# Patient Record
Sex: Male | Born: 1985 | Race: White | Hispanic: No | Marital: Single | Smoking: Never smoker
Health system: Southern US, Community
[De-identification: ages and names within clinical notes are randomized; demographics above are authoritative.]

## PROBLEM LIST (undated history)

## (undated) DIAGNOSIS — F329 Major depressive disorder, single episode, unspecified: Secondary | ICD-10-CM

## (undated) DIAGNOSIS — T8859XA Other complications of anesthesia, initial encounter: Secondary | ICD-10-CM

## (undated) DIAGNOSIS — T4145XA Adverse effect of unspecified anesthetic, initial encounter: Secondary | ICD-10-CM

## (undated) DIAGNOSIS — F32A Depression, unspecified: Secondary | ICD-10-CM

## (undated) DIAGNOSIS — Z9089 Acquired absence of other organs: Secondary | ICD-10-CM

## (undated) DIAGNOSIS — F419 Anxiety disorder, unspecified: Secondary | ICD-10-CM

## (undated) HISTORY — DX: Adverse effect of unspecified anesthetic, initial encounter: T41.45XA

## (undated) HISTORY — DX: Other complications of anesthesia, initial encounter: T88.59XA

## (undated) HISTORY — DX: Acquired absence of other organs: Z90.89

---
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
## 2006-08-29 ENCOUNTER — Ambulatory Visit (HOSPITAL_COMMUNITY): Admission: RE | Admit: 2006-08-29 | Discharge: 2006-08-29 | Payer: Self-pay | Admitting: Family Medicine

## 2008-05-05 ENCOUNTER — Emergency Department (HOSPITAL_COMMUNITY): Admission: EM | Admit: 2008-05-05 | Discharge: 2008-05-05 | Payer: Self-pay | Admitting: Emergency Medicine

## 2009-02-16 ENCOUNTER — Emergency Department (HOSPITAL_COMMUNITY): Admission: EM | Admit: 2009-02-16 | Discharge: 2009-02-16 | Payer: Self-pay | Admitting: Emergency Medicine

## 2010-09-20 ENCOUNTER — Encounter: Payer: Self-pay | Admitting: Family Medicine

## 2010-09-20 ENCOUNTER — Encounter: Payer: Self-pay | Admitting: Internal Medicine

## 2011-06-02 LAB — ROCKY MTN SPOTTED FVR AB, IGG-BLOOD: RMSF IgG: 0.14 IV

## 2011-06-02 LAB — CBC
HCT: 40.6
Hemoglobin: 13.9
MCHC: 34.3
MCV: 86.6
Platelets: 264
RBC: 4.69
RDW: 12.4
WBC: 11.8 — ABNORMAL HIGH

## 2011-06-02 LAB — BASIC METABOLIC PANEL
BUN: 8
CO2: 27
Calcium: 9.1
Chloride: 110
Creatinine, Ser: 0.89
GFR calc Af Amer: >60
GFR calc non Af Amer: >60
Glucose, Bld: 95
Potassium: 3.8
Sodium: 141

## 2011-06-02 LAB — DIFFERENTIAL
Basophils Absolute: 0
Basophils Relative: 0
Eosinophils Absolute: 0
Eosinophils Relative: 0
Lymphocytes Relative: 19
Lymphs Abs: 2.2
Monocytes Absolute: 0.7
Monocytes Relative: 6
Neutro Abs: 8.8 — ABNORMAL HIGH
Neutrophils Relative %: 75

## 2011-06-02 LAB — ROCKY MTN SPOTTED FVR AB, IGM-BLOOD: RMSF IgM: 0.26 IV

## 2016-07-30 HISTORY — PX: WOUND DEBRIDEMENT: SHX247

## 2016-08-19 ENCOUNTER — Encounter (HOSPITAL_COMMUNITY): Payer: Self-pay

## 2016-08-19 ENCOUNTER — Emergency Department (HOSPITAL_COMMUNITY)
Admission: EM | Admit: 2016-08-19 | Discharge: 2016-08-19 | Disposition: A | Discharge status: Home / Self Care | Payer: Self-pay | Attending: Emergency Medicine | Admitting: Emergency Medicine

## 2016-08-19 DIAGNOSIS — Z7982 Long term (current) use of aspirin: Secondary | ICD-10-CM | POA: Insufficient documentation

## 2016-08-19 DIAGNOSIS — S43401A Unspecified sprain of right shoulder joint, initial encounter: Secondary | ICD-10-CM | POA: Insufficient documentation

## 2016-08-19 DIAGNOSIS — Y939 Activity, unspecified: Secondary | ICD-10-CM | POA: Insufficient documentation

## 2016-08-19 DIAGNOSIS — Y999 Unspecified external cause status: Secondary | ICD-10-CM | POA: Insufficient documentation

## 2016-08-19 DIAGNOSIS — T24031A Burn of unspecified degree of right lower leg, initial encounter: Secondary | ICD-10-CM | POA: Insufficient documentation

## 2016-08-19 DIAGNOSIS — Y92410 Unspecified street and highway as the place of occurrence of the external cause: Secondary | ICD-10-CM | POA: Insufficient documentation

## 2016-08-19 DIAGNOSIS — T24001A Burn of unspecified degree of unspecified site of right lower limb, except ankle and foot, initial encounter: Secondary | ICD-10-CM

## 2016-08-19 DIAGNOSIS — Z23 Encounter for immunization: Secondary | ICD-10-CM | POA: Insufficient documentation

## 2016-08-19 MED ORDER — TETANUS-DIPHTH-ACELL PERTUSSIS 5-2.5-18.5 LF-MCG/0.5 IM SUSP
0.5000 mL | Freq: Once | INTRAMUSCULAR | Status: AC
  Administered 2016-08-19: 0.5 mL via INTRAMUSCULAR
  Filled 2016-08-19: qty 0.5

## 2016-08-19 MED ORDER — IBUPROFEN 600 MG PO TABS: 600.0000 mg | ORAL_TABLET | Freq: Four times a day (QID) | ORAL | 0 refills | Status: DC | PRN

## 2016-08-19 MED ORDER — IBUPROFEN 800 MG PO TABS
800.0000 mg | ORAL_TABLET | Freq: Once | ORAL | Status: AC
  Administered 2016-08-19: 800 mg via ORAL
  Filled 2016-08-19: qty 1
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
## 2016-12-04 ENCOUNTER — Encounter (HOSPITAL_COMMUNITY): Payer: Self-pay

## 2016-12-04 ENCOUNTER — Emergency Department (HOSPITAL_COMMUNITY)
Admission: EM | Admit: 2016-12-04 | Discharge: 2016-12-04 | Disposition: A | Discharge status: Home / Self Care | Payer: Self-pay | Attending: Emergency Medicine | Admitting: Emergency Medicine

## 2016-12-04 DIAGNOSIS — Z7982 Long term (current) use of aspirin: Secondary | ICD-10-CM | POA: Insufficient documentation

## 2016-12-04 DIAGNOSIS — K047 Periapical abscess without sinus: Secondary | ICD-10-CM | POA: Insufficient documentation

## 2016-12-04 DIAGNOSIS — Z8659 Personal history of other mental and behavioral disorders: Secondary | ICD-10-CM | POA: Insufficient documentation

## 2016-12-04 DIAGNOSIS — K0889 Other specified disorders of teeth and supporting structures: Secondary | ICD-10-CM

## 2016-12-04 HISTORY — DX: Major depressive disorder, single episode, unspecified: F32.9

## 2016-12-04 HISTORY — DX: Depression, unspecified: F32.A

## 2016-12-04 HISTORY — DX: Anxiety disorder, unspecified: F41.9

## 2016-12-04 MED ORDER — PENICILLIN V POTASSIUM 250 MG PO TABS: 250.0000 mg | ORAL_TABLET | Freq: Three times a day (TID) | ORAL | 0 refills | Status: AC

## 2016-12-04 NOTE — ED Provider Notes (Signed)
The patient is a 31 year old male with a history of depression, he is currently being treated by his family doctor however he feels like his symptoms are persistent, he is not receiving any improvement through this.  He wants more resources - does not have any SI / HI, and no hallucinations.  Also has some toothache in rear molar.  No swelling, no trismus, no drainage / no abscess.  Stable for d/c The pt has already made appt for outpatient mental health eval while in ED.  Medical screening examination/treatment/procedure(s) were conducted as a shared visit with non-physician practitioner(s) and myself.  I personally evaluated the patient during the encounter.  Clinical Impression:   Final diagnoses:  Pain, dental  History of depressive symptoms         Eber Hong, MD 12/04/16 [phone]

## 2016-12-04 NOTE — ED Triage Notes (Signed)
Pt reports depression x one year. Pt has been given antidepressant by PCP and took for 2 weeks stating he stopped due to crazy dreams. Then was given another antidepressant on 4/8 and did not start as he thought was the same medication. Denies SI/HI at this time. States lost mother year ago and others in family. Not sleeping well, loss of weight, not motivated. Also complaining of right lower dental pain for one month

## 2016-12-04 NOTE — ED Provider Notes (Signed)
[facility]-EMERGENCY DEPT Provider Note   CSN: [id number] Arrival date & time: 12/04/16  4098     History   Chief Complaint Chief Complaint  Patient presents with  . Depression  . Dental Pain    HPI Eduardo Stuart is a 31 y.o. male who presents with ongoing depression symptoms for the past year. Patient states he has been experiencing anhedonia, irritability, decreased appetite/weight loss, and changes in his sleep pattern. Patient reports that symptoms were triggered by the death of his mother in 10-22-15. He was initially evaluated by a therapist but there was question of whether this was depression or grief and he was not started on any medications. He has intermittently seen a therapist since then, last time being 2 weeks ago, but with no regular follow-up. He states he often consults his friend or sister in law who is one. He was seen at [facility] a few weeks ago and was started on Duloxetine, but he reports that it made him have dreams and SI and so he stopped after 2 weeks. He was supposed to be prescribed a new medication but noted that it was the same one and has not taken it. He currently lives with his dad. He denies any SI/HI, auditory/visual hallucinations.   He also reports of persistent dental pain that began a few weeks ago after he cracked his tooth. He has taken tylenol for the pain with minimal relief. He has not been evaluated by the dentist. He's had some subjective fevers a few weeks ago, associated with some GI distress, but none since. He is still able to tolerate PO. No facial swelling associated.   The history is provided by the patient.    Past Medical History:  Diagnosis Date  . Anxiety   . Depression     There are no active problems to display for this patient.   History reviewed. No pertinent surgical history.     Home Medications    Prior to Admission medications   Medication Sig Start Date End Date Taking? Authorizing Provider  Aspirin-Caffeine  845-65 MG PACK Take 2 Packages by mouth daily as needed (pain).   Yes Historical Provider, MD  DULoxetine (CYMBALTA) 30 MG capsule Take 30 mg by mouth daily.    Historical Provider, MD  DULoxetine (CYMBALTA) 60 MG capsule Take 60 mg by mouth daily.    Historical Provider, MD  penicillin v potassium (VEETID) 250 MG tablet Take 1 tablet (250 mg total) by mouth 3 (three) times daily. 12/04/16 12/11/16  Maxwell Caul, PA-C    Family History No family history on file.  Social History Social History  Substance Use Topics  . Smoking status: Never Smoker  . Smokeless tobacco: Never Used  . Alcohol use Yes     Comment: beer every other week     Allergies   Sulfa antibiotics   Review of Systems Review of Systems  Constitutional: Positive for activity change, appetite change and fever (subjective).  HENT: Positive for dental problem. Negative for drooling and facial swelling.   Respiratory: Negative for shortness of breath.   Cardiovascular: Negative for chest pain.  Gastrointestinal: Negative for abdominal pain, diarrhea, nausea and vomiting.  All other systems reviewed and are negative.    Physical Exam Updated Vital Signs BP 93/80   Pulse (!) 102   Temp 99.1 F (37.3 C) (Oral)   Resp 12   Ht 6' (1.829 m)   Wt 63.5 kg   SpO2 97%   BMI  18.99 kg/m   Physical Exam  Constitutional: He appears well-developed and well-nourished.  HENT:  Head: Normocephalic and atraumatic.  Mouth/Throat: Mucous membranes are normal. No oral lesions. No trismus in the jaw. Dental abscesses present.    No right sided facial swelling, redness, or tenderness. No gingival swelling.  Eyes: Conjunctivae and EOM are normal. Right eye exhibits no discharge. Left eye exhibits no discharge. No scleral icterus.  Cardiovascular: Normal rate, regular rhythm and intact distal pulses.   Pulmonary/Chest: Effort normal and breath sounds normal.  Musculoskeletal: He exhibits no deformity.  Neurological: He is  alert.  Skin: Skin is warm and dry.  Psychiatric: He has a normal mood and affect. His speech is normal and behavior is normal.  No flat affected noted     ED Treatments / Results  Labs (all labs ordered are listed, but only abnormal results are displayed) Labs Reviewed - No data to display  EKG  EKG Interpretation None       Radiology No results found.  Procedures Procedures (including critical care time)  Medications Ordered in ED Medications - No data to display   Initial Impression / Assessment and Plan / ED Course  I have reviewed the triage vital signs and the nursing notes.  Pertinent labs & imaging results that were available during my care of the patient were reviewed by me and considered in my medical decision making (see chart for details).    31 yo M presents with persistent depression that has been ongoing for a year. Has been seen by outpatient psychiatric resource, but very inconsistent follow-up. Was recently started on Duloxetine by [facility] but patient stopped after 2 weeks because of side effects. Patient is afebrile, non-toxic appearing, sitting comfortably on examination table. No flat affected noted. No active SI/HI or auditory/visual hallucinations. Also complaining of some dental pain after he cracked his tooth a few weeks ago. He does not follow-up with a dentist. Low suspicion of dental abscess given benign physical exam.   Given history/physical exam, patient does not meet criteria for acute psychiatric intervention. He is not currently having any SI/HI. He lives at home with his father. Given his duration of symptoms, he would be managed by out continued, consistent outpatient psychiatric therapy. Discussed with patient that I do not want to start him on a new anti-depressant medication in an acute ED setting given lack of follow-up.  Will provide list of outpatient psychiatric services for patient to follow-up with.   No signs of  abscess on exam, but patient very concerned. Will plan to cover with abx and NSAIDs for pain relief. Will provide dental resources for outpatient follow-up.   Discussed plan with patient. He is planning to go to one of the referred psych resources today for further follow-up. Still Return precautions discussed. Patient expresses understanding and agreement to plan.      Final Clinical Impressions(s) / ED Diagnoses   Final diagnoses:  Pain, dental  History of depressive symptoms    New Prescriptions Discharge Medication List as of 12/04/2016 12:13 PM    START taking these medications   Details  penicillin v potassium (VEETID) 250 MG tablet Take 1 tablet (250 mg total) by mouth 3 (three) times daily., Starting Sat 12/04/2016, Until Sat 12/11/2016, Print         Maxwell Caul, PA-C 12/04/16 1245    Eber Hong, MD 12/04/16 [phone]

## 2016-12-04 NOTE — Discharge Instructions (Addendum)
RESOURCE GUIDE  Dental Problems  Patients with Medicaid: [facility]                     [facility] [phone] [address]                                           [phone] [address] [employer] [address] Phone:  [phone]                                                  Phone:  [phone]  If unable to pay or uninsured, contact:  [facility] for the adult dental clinic.  Chronic Pain Problems Contact [facility]  [phone] Patients need to be referred by their primary care doctor.  Insufficient Money for Medicine Contact [employer]:  call "211" or [facility] [phone].  No Primary Care Doctor Call [facility]  [phone] Other agencies that provide inexpensive medical care    [facility]  [phone]    [facility]  [phone]    [facility]  [phone]    [facility]  [phone]    Planned Parenthood  [phone]    [facility]  [phone]    Substance Abuse Treatment Programs  Intensive Outpatient Programs [facility]     [address]      [place], [place]                   [phone]       [facility] [address] [place], [place] [phone]  [facility]     (Inpatient and outpatient)     [address]           [phone]    [facility] [phone] (Suboxone and Methadone)  [address]      [place], [place] [address]      [phone]       [address] [place], [place] [phone]  [facility] (Outpatient/Inpatient, Chemical)    (insurance only) [phone]             [facility] (Groups & Residential) [place], [place] [phone]     [facility]     [address]     [place], [place]      [phone]       [facility] (for caregivers and family) [phone]  [address] [place], [place] [phone]      Residential Treatment Programs [facility]      [address], [place], [place] [address]  [phone]       [facility] [address] [place], [place] [address] [phone]  [facility] [place]        [phone]       [facility] [phone]  [facility] ([facility]             [address]  [place], [place]                                                [phone] or [phone]                               [facility] [address] [place], [address] [phone]  [facility]    [address]      [place], [place]     [phone]       [facility] [address] [place], [place] [phone]  [facility]   [address] [place], [place] [address]     [phone]      Admissions: 8am-3pm M-F  [facility] [address] [place] [address] [phone]  [facility] Services ([facility]) [address] [place], [place] [phone]  [facility]: Residential Program [phone] Days)   [place], [place]      [phone] or [phone]     [facility]: [facility] [place], [place] (Walk in Hours over the weekend or by referral)  [facility] [address] [place], [place], [place] [address] [phone]  Crisis Mobile: [facility]:  [phone] (for crisis response 24 hours a day) [facility] Hotline:      7196462601 Outpatient Psychiatry and Counseling  [facility]: Mobile Crisis Management 24 hours:  [phone]  [facility] of the [employer] sliding scale fee and walk in schedule: M-F 8am-12pm/1pm-3pm [address]  [place], [place] [address] [phone]  [facility] [address] [place], [place] [address] [phone]  [facility] ([facility] as [facility] [employer])- new patient walk-in appointments available Monday - Friday 8am -3pm.          [address] [place], [place] [address] [phone] or crisis line- [phone]  [facility] Services/ Intensive Outpatient Therapy Program [address] [place], [place] [address] [phone]  [facility]                  Crisis Services      [phone] [address]     [place], [place] [address]                 [facility] [phone]. [address] [place], [place] [address]   [facility] of Care          [address]  [place], [place] [address]       [phone]  [facility] [address] [place], [place] [address] [phone]  [facility]    [address]    [place], [place] [address]     [phone]       Andee Poles, MD     [address]     [place] [address]     [phone]       [facility] [address] [place] [address]  [facility]     [address] [place], [place]      [phone]       [facility] Eulogio Ditch, MD [address] [place], [place] [address] [phone]  [facility]     [address]     [place], [place] [address]     [phone]       [facility] [address] [place], [place] [address] [phone] Resources for Temporary Residential Assistance/Crisis Centers  [facility]) M-F 8am-3pm   [address] [place], [place] [address]   [phone] Services include: laundry, barbering, support groups, case management, phone  & computer access, showers, AA/NA mtgs, mental health/substance abuse nurse, job skills class, disability information, VA assistance, spiritual classes, etc.   HOMELESS  SHELTERS  [facility]     [facility]   [address] [place]     [phone]              [employer] (women and children)       [address] [place], [place] [address] [phone] [email or web address] for application and process Application Required  [facility] [employer] [facility]   [address]    [place] [address]     [phone]                    [facility] [place] [address] [place], [place] [address] [phone] application appt.) Application Required  [facility] (women only)    [address]     [place], [place] [address]     [phone]      Intake starts 6pm daily Need valid ID, SSC, & Police report [employer] [address] [place], [place] [phone] Application Required  [employer] (men only)     [address]      [place], [place]     [phone]       [facility] of the [place] (Pregnant women only) [address] [place], [place] [phone]  [facility]      [address]      [place], [place] [address]     [phone]             [facility] [address] [place], [place] [phone] 90 day commitment/SA/Application process  [facility](men only)     [address]     [place], [place]     [phone]       Check-in at [facility] [address] [place], [place] [address] [phone] Men/Women/Women and Children must be there by 7 pm  [facility] [place], [place] [phone]

## 2017-05-11 ENCOUNTER — Encounter: Payer: Self-pay | Admitting: Gastroenterology

## 2017-07-01 ENCOUNTER — Ambulatory Visit: Payer: Self-pay | Admitting: Gastroenterology

## 2017-07-01 ENCOUNTER — Encounter: Payer: Self-pay | Admitting: Gastroenterology

## 2017-07-14 ENCOUNTER — Emergency Department (HOSPITAL_COMMUNITY)
Admission: EM | Admit: 2017-07-14 | Discharge: 2017-07-14 | Disposition: A | Discharge status: Home / Self Care | Payer: Self-pay | Attending: Emergency Medicine | Admitting: Emergency Medicine

## 2017-07-14 ENCOUNTER — Encounter (HOSPITAL_COMMUNITY): Payer: Self-pay | Admitting: *Deleted

## 2017-07-14 ENCOUNTER — Other Ambulatory Visit: Payer: Self-pay

## 2017-07-14 ENCOUNTER — Emergency Department (HOSPITAL_COMMUNITY): Payer: Self-pay

## 2017-07-14 DIAGNOSIS — G459 Transient cerebral ischemic attack, unspecified: Secondary | ICD-10-CM | POA: Insufficient documentation

## 2017-07-14 DIAGNOSIS — Z7982 Long term (current) use of aspirin: Secondary | ICD-10-CM | POA: Insufficient documentation

## 2017-07-14 DIAGNOSIS — Z79899 Other long term (current) drug therapy: Secondary | ICD-10-CM | POA: Insufficient documentation

## 2017-07-14 LAB — CBC
HEMATOCRIT: 42 % (ref 39.0–52.0)
Hemoglobin: 14.3 g/dL (ref 13.0–17.0)
MCH: 31.4 pg (ref 26.0–34.0)
MCHC: 34 g/dL (ref 30.0–36.0)
MCV: 92.3 fL (ref 78.0–100.0)
Platelets: 318 10*3/uL (ref 150–400)
RBC: 4.55 MIL/uL (ref 4.22–5.81)
RDW: 12.8 % (ref 11.5–15.5)
WBC: 9.6 10*3/uL (ref 4.0–10.5)

## 2017-07-14 LAB — RAPID URINE DRUG SCREEN, HOSP PERFORMED
Amphetamines: NOT DETECTED
BARBITURATES: NOT DETECTED
Benzodiazepines: POSITIVE — AB
Cocaine: NOT DETECTED
Opiates: NOT DETECTED
TETRAHYDROCANNABINOL: NOT DETECTED

## 2017-07-14 LAB — COMPREHENSIVE METABOLIC PANEL
ALK PHOS: 84 U/L (ref 38–126)
ALT: 12 U/L — AB (ref 17–63)
AST: 17 U/L (ref 15–41)
Albumin: 4.8 g/dL (ref 3.5–5.0)
Anion gap: 10 (ref 5–15)
BILIRUBIN TOTAL: 0.5 mg/dL (ref 0.3–1.2)
BUN: 18 mg/dL (ref 6–20)
CALCIUM: 9.6 mg/dL (ref 8.9–10.3)
CO2: 27 mmol/L (ref 22–32)
CREATININE: 1.32 mg/dL — AB (ref 0.61–1.24)
Chloride: 105 mmol/L (ref 101–111)
GFR calc Af Amer: >60 mL/min (ref >60)
GLUCOSE: 95 mg/dL (ref 65–99)
Potassium: 4.3 mmol/L (ref 3.5–5.1)
SODIUM: 142 mmol/L (ref 135–145)
TOTAL PROTEIN: 7.8 g/dL (ref 6.5–8.1)

## 2017-07-14 LAB — I-STAT CHEM 8, ED
BUN: 19 mg/dL (ref 6–20)
CALCIUM ION: 1.16 mmol/L (ref 1.15–1.40)
CHLORIDE: 106 mmol/L (ref 101–111)
Creatinine, Ser: 1.3 mg/dL — ABNORMAL HIGH (ref 0.61–1.24)
Glucose, Bld: 89 mg/dL (ref 65–99)
HCT: 40 % (ref 39.0–52.0)
HEMOGLOBIN: 13.6 g/dL (ref 13.0–17.0)
Potassium: 4.3 mmol/L (ref 3.5–5.1)
SODIUM: 144 mmol/L (ref 135–145)
TCO2: 29 mmol/L (ref 22–32)

## 2017-07-14 LAB — URINALYSIS, ROUTINE W REFLEX MICROSCOPIC
Bacteria, UA: NONE SEEN
Bilirubin Urine: NEGATIVE
GLUCOSE, UA: NEGATIVE mg/dL
HGB URINE DIPSTICK: NEGATIVE
Ketones, ur: NEGATIVE mg/dL
LEUKOCYTES UA: NEGATIVE
Nitrite: NEGATIVE
PROTEIN: 30 mg/dL — AB
SPECIFIC GRAVITY, URINE: 1.038 — AB (ref 1.005–1.030)
pH: 5 (ref 5.0–8.0)

## 2017-07-14 LAB — ETHANOL

## 2017-07-14 LAB — DIFFERENTIAL
Basophils Absolute: 0.1 10*3/uL (ref 0.0–0.1)
Basophils Relative: 1 %
EOS PCT: 1 %
Eosinophils Absolute: 0.1 10*3/uL (ref 0.0–0.7)
Lymphocytes Relative: 28 %
Lymphs Abs: 2.7 10*3/uL (ref 0.7–4.0)
MONO ABS: 0.8 10*3/uL (ref 0.1–1.0)
MONOS PCT: 8 %
Neutro Abs: 5.9 10*3/uL (ref 1.7–7.7)
Neutrophils Relative %: 62 %

## 2017-07-14 LAB — APTT: aPTT: 33 seconds (ref 24–36)

## 2017-07-14 LAB — I-STAT TROPONIN, ED: TROPONIN I, POC: 0 ng/mL (ref 0.00–0.08)

## 2017-07-14 LAB — PROTIME-INR
INR: 1.03
PROTHROMBIN TIME: 13.4 s (ref 11.4–15.2)

## 2017-07-14 MED ORDER — SODIUM CHLORIDE 0.9 % IV BOLUS (SEPSIS)
1000.0000 mL | Freq: Once | INTRAVENOUS | Status: AC
  Administered 2017-07-14: 1000 mL via INTRAVENOUS

## 2017-07-14 NOTE — ED Triage Notes (Signed)
Pt concerned that his speech has been slurred, having problems getting "my words out", generalized weakness. Per pt symptoms started at 1 pm today, father at bedside states that pt\'s speech does not seem right,

## 2017-07-14 NOTE — ED Provider Notes (Signed)
[facility]ENCY [facility]MENT Provider Note   CSN: [id number] Arrival date & time: 07/14/17  [id number]   An emergency department physician performed an initial assessment on this suspected stroke patient at 2030.  History   Chief Complaint Chief Complaint  Patient presents with  . Aphasia    HPI Eduardo Stuart is a 31 y.o. male.  Patient started approximately around 1 PM today with mild slurred speech and ataxia   The history is provided by the patient and a relative. No language interpreter was used.  Altered Mental Status   This is a new problem. The current episode started 3 to 5 hours ago. The problem has not changed since onset.Pertinent negatives include no confusion, no seizures and no hallucinations. Risk factors: Unknown. His past medical history does not include seizures.    Past Medical History:  Diagnosis Date  . Anxiety   . Depression     There are no active problems to display for this patient.   Past Surgical History:  Procedure Laterality Date  . WOUND DEBRIDEMENT Right 07/30/2016       Home Medications    Prior to Admission medications   Medication Sig Start Date End Date Taking? Authorizing Provider  Aspirin-Caffeine 845-65 MG PACK Take 2 Packages by mouth daily as needed (pain).    [provider]  DULoxetine (CYMBALTA) 30 MG capsule Take 30 mg by mouth daily.    [provider]  DULoxetine (CYMBALTA) 60 MG capsule Take 60 mg by mouth daily.    [provider]    Family History No family history on file.  Social History Social History   Tobacco Use  . Smoking status: Never Smoker  . Smokeless tobacco: Never Used  Substance Use Topics  . Alcohol use: Yes    Comment: beer every other week  . Drug use: No     Allergies   Sulfa antibiotics   Review of Systems Review of Systems  Constitutional: Negative for appetite change and fatigue.  HENT: Negative for congestion, ear discharge and sinus pressure.     Eyes: Negative for discharge.  Respiratory: Negative for cough.   Cardiovascular: Negative for chest pain.  Gastrointestinal: Negative for abdominal pain and diarrhea.  Genitourinary: Negative for frequency and hematuria.  Musculoskeletal: Negative for back pain.  Skin: Negative for rash.  Neurological: Negative for seizures and headaches.       Mild slurred speech and ataxia  Psychiatric/Behavioral: Negative for confusion and hallucinations.     Physical Exam Updated Vital Signs BP 121/79   Pulse 72   Temp 97.9 F (36.6 C) (Oral)   Resp 11   Ht 5\' 11"  (1.803 m)   Wt 67.1 kg (148 lb)   SpO2 97%   BMI 20.64 kg/m   Physical Exam  Constitutional: He is oriented to person, place, and time. He appears well-developed.  HENT:  Head: Normocephalic.  Eyes: Conjunctivae and EOM are normal. No scleral icterus.  Neck: Neck supple. No thyromegaly present.  Cardiovascular: Normal rate and regular rhythm. Exam reveals no gallop and no friction rub.  No murmur heard. Pulmonary/Chest: No stridor. He has no wheezes. He has no rales. He exhibits no tenderness.  Abdominal: He exhibits no distension. There is no tenderness. There is no rebound.  Musculoskeletal: Normal range of motion. He exhibits no edema.  Lymphadenopathy:    He has no cervical adenopathy.  Neurological: He is oriented to person, place, and time. He exhibits normal muscle tone. Coordination normal.  Patient speech was mildly slow but he could not enunciate just fine.  Patient was able to walk slowly without any lateralizing neurological signs  Skin: No rash noted. No erythema.  Psychiatric: He has a normal mood and affect. His behavior is normal.     ED Treatments / Results  Labs (all labs ordered are listed, but only abnormal results are displayed) Labs Reviewed  COMPREHENSIVE METABOLIC PANEL - Abnormal; Notable for the following components:      Result Value   Creatinine, Ser 1.32 (*)    ALT 12 (*)    All other  components within normal limits  RAPID URINE DRUG SCREEN, HOSP PERFORMED - Abnormal; Notable for the following components:   Benzodiazepines POSITIVE (*)    All other components within normal limits  URINALYSIS, ROUTINE W REFLEX MICROSCOPIC - Abnormal; Notable for the following components:   Color, Urine AMBER (*)    Specific Gravity, Urine 1.038 (*)    Protein, ur 30 (*)    Squamous Epithelial / LPF 0-5 (*)    All other components within normal limits  I-STAT CHEM 8, ED - Abnormal; Notable for the following components:   Creatinine, Ser 1.30 (*)    All other components within normal limits  ETHANOL  PROTIME-INR  APTT  CBC  DIFFERENTIAL  I-STAT TROPONIN, ED    EKG  EKG Interpretation  Date/Time:  Thursday July 14 2017 20:28:55 EST Ventricular Rate:  73 PR Interval:    QRS Duration: 113 QT Interval:  372 QTC Calculation: 410 R Axis:   45 Text Interpretation:  Sinus rhythm Consider left atrial enlargement Borderline intraventricular conduction delay Confirmed by Bethann BerkshireZammit, Shawndell Varas [phone]([id number]) on 07/14/2017 10:08:17 PM       Radiology Ct Head Code Stroke Wo Contrast  Result Date: 07/14/2017 CLINICAL DATA:  Code stroke. Slurred speech, gait instability at 1300 hours. Generalized weakness. EXAM: CT HEAD WITHOUT CONTRAST TECHNIQUE: Contiguous axial images were obtained from the base of the skull through the vertex without intravenous contrast. COMPARISON:  None. FINDINGS: BRAIN: No intraparenchymal hemorrhage, mass effect nor midline shift. The ventricles and sulci are normal. LEFT greater than RIGHT temporal lobe hypodensities. No abnormal extra-axial fluid collections. Basal cisterns are patent. VASCULAR: Unremarkable. SKULL/SOFT TISSUES: No skull fracture. No significant soft tissue swelling. ORBITS/SINUSES: The included ocular globes and orbital contents are normal.The mastoid aircells and included paranasal sinuses are well-aerated. OTHER: None. IMPRESSION: 1. Faint hypodensities  bilateral temporal lobes favoring skullbase CT artifact. If symptoms persist, consider MRI of the brain. If potential for cerebritis, MRI with contrast would be warranted. 2. Critical Value/emergent results were called by telephone at the time of interpretation on 07/14/2017 at 8:39 pm to Dr. Bethann BerkshireJOSEPH Kendel Bessey , who verbally acknowledged these results. Electronically Signed   By: Awilda Metroourtnay  Bloomer M.D.   On: 07/14/2017 20:39    Procedures Procedures (including critical care time)  Medications Ordered in ED Medications  sodium chloride 0.9 % bolus 1,000 mL (1,000 mLs Intravenous New Bag/Given 07/14/17 2046)     Initial Impression / Assessment and Plan / ED Course  I have reviewed the triage vital signs and the nursing notes.  Pertinent labs & imaging results that were available during my care of the patient were reviewed by me and considered in my medical decision making (see chart for details).     Patient was seen by neurology and they suggested getting an MRI in the morning and admitting the patient to observation.  Patient was feeling back to his normal  self and he decided to leave AMA.  He did have a positive drug screen for Xanax.  He will follow-up with his primary care doctor or return if problems  Final Clinical Impressions(s) / ED Diagnoses   Final diagnoses:  TIA (transient ischemic attack)    ED Discharge Orders    None       Bethann Berkshire, MD 07/14/17 2215

## 2017-07-14 NOTE — Consult Note (Signed)
[facility] Tele[facility]urology Consult Services   Asked to see this patient in telemedicine consultation. Consultation was performed with assistance of ancillary / medical staff at bedside. ?   Verbal consent to perform the examination with telemedicine was obtained. Patient and family agreed to proceed with the consultation.   Impression: Slurred speech  Subjectively he was told his speech was slurred, he feels slow but not slurred  Some imbalance but no focal weakness  NIHSS 0  Not a tpa candidate due to:  Non-focal exam  Not an NIR candidate due to:  Non-focal exam  Differential Diagnosis:   Stroke/TIA  Toxic-metabolic encephalopathy  Demyelinating disease  Migraine   Metrics: N/A as requested as STAT consult  Recommendations:    Antiplatelet therapy with ASA 325 daily  DVT Prophylaxis  Dysphagia Screen  Inpatient diagnostic testing to consider includes:  MRI brain  Admit for obs and if MRI negative/symtpoms resolved work-up as outpatient  Consider Inpatient neurology consultation esp if MRI brain abnorma  Discussed with ED MD     CC:  Speech changes  Date of Consult:  07/14/17    HPI:  At about 1330 today speech changes noted  Was told speech slurred, he felt speech was slow  Also imbalance but no focal weakness  Due to persistence of symptoms ultimately to ED this evening  No similar symptoms in the past other than when he tried lexapro --> stopped 2-3 weeks ago  PRN BDZ when difficulty sleeping, last took 3-4 days ago  Feeling well now other than 'slow' speech   Past Medical History:  Diagnosis Date  . Anxiety   . Depression     Prior to Admission medications   Medication Sig Start Date End Date Taking? Authorizing Provider  Aspirin-Caffeine 845-65 MG PACK Take 2 Packages by mouth daily as needed (pain).    [provider]  DULoxetine (CYMBALTA) 30 MG capsule Take 30 mg by mouth daily.    [provider]   DULoxetine (CYMBALTA) 60 MG capsule Take 60 mg by mouth daily.    [provider]     Allergies  Allergen Reactions  . Sulfa Antibiotics Other (See Comments)    Childhood allergy.     Social History   Tobacco Use  . Smoking status: Never Smoker  . Smokeless tobacco: Never Used  Substance Use Topics  . Alcohol use: Yes    Comment: beer every other week  . Drug use: No    No family history on file.      Physical Exam  Vitals:   07/14/17 2008  BP: 126/86  Pulse: 72  Resp: 20  Temp: 97.9 F (36.6 C)  SpO2: 97%     NIHSS  LOC - 0  LOC questions - 0  LOC commands - 0  EOM - 0  VF - 0  Face - 0  Motor Upper Ext - 0  Motor Lower Ext - 0  Coordination - 0  Sensory - 0  Language - 0  Speech - 0  Extinction - 0  NIHSS total - 0   Lab/Data Review  CT Head - reviewed - no acute process   Results for Shade FloodSWIFT, Dimetri A (MRN [id number]) as of 07/14/2017 20:59  Ref. Range 07/14/2017 20:37  Sodium Latest Ref Range: 135 - 145 mmol/L 144  Potassium Latest Ref Range: 3.5 - 5.1 mmol/L 4.3  Chloride Latest Ref Range: 101 - 111 mmol/L 106  Glucose Latest Ref Range: 65 - 99 mg/dL  89  BUN Latest Ref Range: 6 - 20 mg/dL 19  Creatinine Latest Ref Range: 0.61 - 1.24 mg/dL 0.981.30 (H)  Calcium Ionized Latest Ref Range: 1.15 - 1.40 mmol/L 1.16    Results for Shade FloodSWIFT, Kolyn A (MRN [id number]) as of 07/14/2017 20:59  Ref. Range 07/14/2017 20:21  WBC Latest Ref Range: 4.0 - 10.5 K/uL 9.6  RBC Latest Ref Range: 4.22 - 5.81 MIL/uL 4.55  Hemoglobin Latest Ref Range: 13.0 - 17.0 g/dL 56.214.3  HCT Latest Ref Range: 39.0 - 52.0 % 42.0  MCV Latest Ref Range: 78.0 - 100.0 fL 92.3  MCH Latest Ref Range: 26.0 - 34.0 pg 31.4  MCHC Latest Ref Range: 30.0 - 36.0 g/dL 13.034.0  RDW Latest Ref Range: 11.5 - 15.5 % 12.8  Platelets Latest Ref Range: 150 - 400 K/uL 318     Medical Decision Making:  - Extensive number of diagnosis or management options are considered above.    - Extensive amount of complex data reviewed.   - High risk of complication and/or morbidity or mortality are associated with differential diagnostic considerations above.  - There may be Uncertain outcome and increased probability of prolonged functional impairment or high probability of severe prolonged functional impairment associated with some of these differential diagnosis.  Medical Data Reviewed:  1.Data reviewed include clinical labs, radiology,  Medical Tests;   2.Tests results discussed w/performing or interpreting physician;   3.Obtaining/reviewing old medical records;  4.Obtaining case history from another source;  5.Independent review of image, tracing or specimen.

## 2017-07-14 NOTE — ED Notes (Signed)
ED Provider at bedside. 

## 2017-07-14 NOTE — ED Notes (Signed)
Code stroke ruled negative and the code stroke called off. Will continue to monitor the patient and his neuroglial condition

## 2017-07-14 NOTE — Discharge Instructions (Signed)
Follow-up with your primary care doctor next week and get an MRI scheduled for your brain

## 2017-07-14 NOTE — Progress Notes (Signed)
CODE STROKE 2016 beeper 2016 exam began 2019 exam finished, [facility]am completed in Epic, [facility] called spoke with Bjorn Loserhonda

## 2017-07-14 NOTE — ED Notes (Signed)
Pt now states that  He felt "groggy" when he got up this am,

## 2017-07-28 ENCOUNTER — Ambulatory Visit (INDEPENDENT_AMBULATORY_CARE_PROVIDER_SITE_OTHER): Payer: Self-pay | Admitting: Orthopaedic Surgery

## 2017-08-03 ENCOUNTER — Ambulatory Visit (INDEPENDENT_AMBULATORY_CARE_PROVIDER_SITE_OTHER): Payer: Self-pay | Admitting: Orthopaedic Surgery

## 2017-08-03 ENCOUNTER — Encounter (INDEPENDENT_AMBULATORY_CARE_PROVIDER_SITE_OTHER): Payer: Self-pay | Admitting: Orthopaedic Surgery

## 2017-08-03 VITALS — BP 110/74 | HR 87 | Resp 14 | Ht 72.0 in | Wt 155.0 lb

## 2017-08-03 DIAGNOSIS — M25511 Pain in right shoulder: Secondary | ICD-10-CM

## 2017-08-03 MED ORDER — BUPIVACAINE HCL 0.5 % IJ SOLN
2.0000 mL | INTRAMUSCULAR | Status: AC | PRN
  Administered 2017-08-03: 2 mL via INTRA_ARTICULAR

## 2017-08-03 MED ORDER — METHYLPREDNISOLONE ACETATE 40 MG/ML IJ SUSP
80.0000 mg | INTRAMUSCULAR | Status: AC | PRN
  Administered 2017-08-03: 80 mg

## 2017-08-03 MED ORDER — LIDOCAINE HCL 2 % IJ SOLN
2.0000 mL | INTRAMUSCULAR | Status: AC | PRN
  Administered 2017-08-03: 2 mL

## 2017-08-03 NOTE — Progress Notes (Signed)
Office Visit Note   Patient: Eduardo Stuart A Wegener           Date of Birth: 09/15/1985           MRN: [id number] Visit Date: 08/03/2017              Requested by: Elfredia NevinsFusco, Lawrence, MD [address] [place], [place] [id number] PCP: Elfredia NevinsFusco, Lawrence, MD   Assessment & Plan: Visit Diagnoses:  1. Acute pain of right shoulder    Mildly positive impingement right shoulder with probable bruise of deltoid muscle. We'll try a subacromial cortisone injection and monitor his response. If no improvement over the next several weeks and consider MRI scan Follow-Up Instructions: Return if symptoms worsen or fail to improve.   Orders:  Orders Placed This Encounter  Procedures  . Large Joint Inj: R subacromial bursa   No orders of the defined types were placed in this encounter.     Procedures: Large Joint Inj: R subacromial bursa on 08/03/2017 2:56 PM Indications: pain and diagnostic evaluation Details: 25 G 1.5 in needle, anterolateral approach  Arthrogram: No  Medications: 2 mL lidocaine 2 %; 2 mL bupivacaine 0.5 %; 80 mg methylPREDNISolone acetate 40 MG/ML Consent was given by the patient. Immediately prior to procedure a time out was called to verify the correct patient, procedure, equipment, support staff and site/side marked as required. Patient was prepped and draped in the usual sterile fashion.       Clinical Data: No additional findings.   Subjective: Chief Complaint  Patient presents with  . Right Shoulder - Pain    Mr. Eduardo Stuart is a 31 y o here for acute right shoulder pain x 4 weeks. He fell on 11/15 his shoulder hit a dresser. Pain radiates to humerus with pain, ultram, tylenol and a sling.  31 year old young man apparently "passed out" 3 weeks ago when he tripped over his dog and fell landing against a heavy object. He thinks he was probably "out" for about 3 hours. When he woke up he was having pain in the area of his right shoulder. He didn't notice any ecchymosis or redness  or any obvious deformity. He's had some difficulty raising his arm over his head. He is been seen by his primary care physician and was some concern he may have a rotator cuff problem and accordingly was referred to the office. He also initially had some numbness in the ulnar 2 digits of his right hand which has resolved. He was not having any trouble with the shoulder prior to this injury  HPI  Review of Systems  Constitutional: Negative for fatigue.  HENT: Negative for hearing loss.   Respiratory: Negative for apnea, chest tightness and shortness of breath.   Cardiovascular: Negative for chest pain, palpitations and leg swelling.  Gastrointestinal: Negative for blood in stool, constipation and diarrhea.  Genitourinary: Negative for difficulty urinating.  Musculoskeletal: Negative for arthralgias, back pain, joint swelling, myalgias, neck pain and neck stiffness.  Neurological: Negative for weakness, numbness and headaches.  Hematological: Does not bruise/bleed easily.  Psychiatric/Behavioral: Positive for sleep disturbance. The patient is nervous/anxious.      Objective: Vital Signs: BP 110/74   Pulse 87   Resp 14   Ht 6' (1.829 m)   Wt 155 lb (70.3 kg)   BMI 21.02 kg/m   Physical Exam  Ortho Exam awake alert and oriented 3 comfortable sitting. He had some difficulty raising his arm over his head there was a painful arc but  he was able place a fully over his head. Some pain with abduction of the subacromial region anteriorly and laterally. Biceps intact. Skin intact. No pain at the acromioclavicular joint. No pain over the ulnar nerve. Neurovascular exam intact. No pain with range of motion of the cervical spine. No crepitation with shoulder motion. No apprehension. Some tenderness over the deltoid and triceps but no bruising  Specialty Comments:  No specialty comments available.  Imaging: No results found.   PMFS History: There are no active problems to display for this  patient.  Past Medical History:  Diagnosis Date  . Anxiety   . Complication of anesthesia   . Depression   . Hx of tonsillectomy     Family History  Problem Relation Age of Onset  . COPD Father   . Asthma Father     Past Surgical History:  Procedure Laterality Date  . WOUND DEBRIDEMENT Right 07/30/2016   Social History   Occupational History  . Not on file  Tobacco Use  . Smoking status: Never Smoker  . Smokeless tobacco: Current User    Types: Chew  Substance and Sexual Activity  . Alcohol use: Yes    Comment: beer every other week  . Drug use: No  . Sexual activity: Not on file

## 2017-10-28 DEATH — deceased

## 2018-02-16 IMAGING — CT CT HEAD CODE STROKE
3 series · 16 of 47 positions shown, 19 images · non-contrast
Comparison: None.

CLINICAL DATA: Code stroke. Slurred speech, gait instability at
3488 hours. Generalized weakness.

EXAM:
CT HEAD WITHOUT CONTRAST
TECHNIQUE: Contiguous axial images were obtained from the base of the skull
through the vertex without intravenous contrast.

[Series 2: head code stroke wo · axial · 0.44mm/px · z∈[+1523,+1658]mm · 10 of 33 slices shown, 13 images]
[im 3/33  brain]
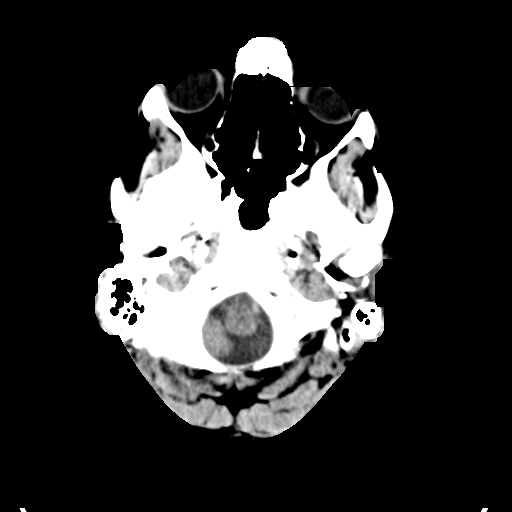
[im 3/33  bone]
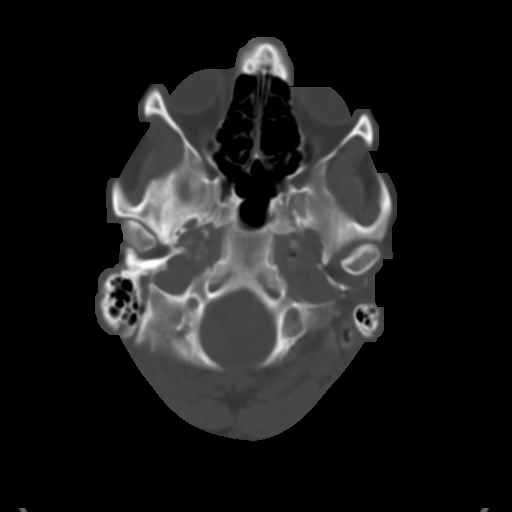
[im 6/33  brain]
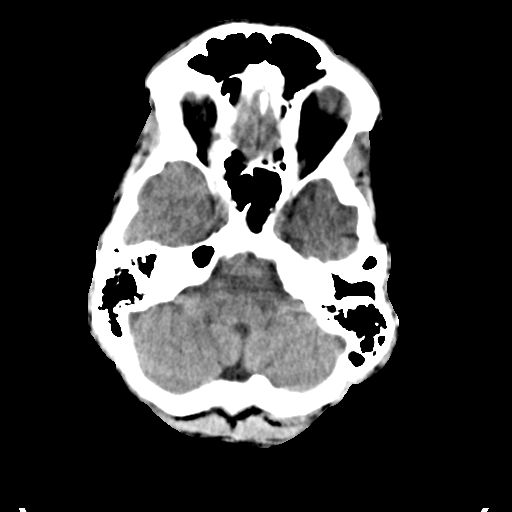
[im 9/33  brain]
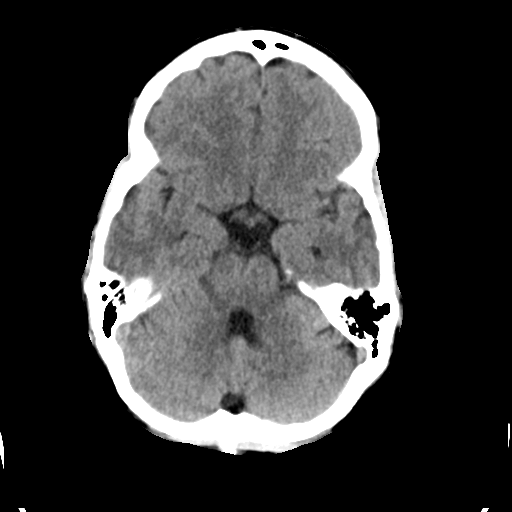
[im 12/33  brain]
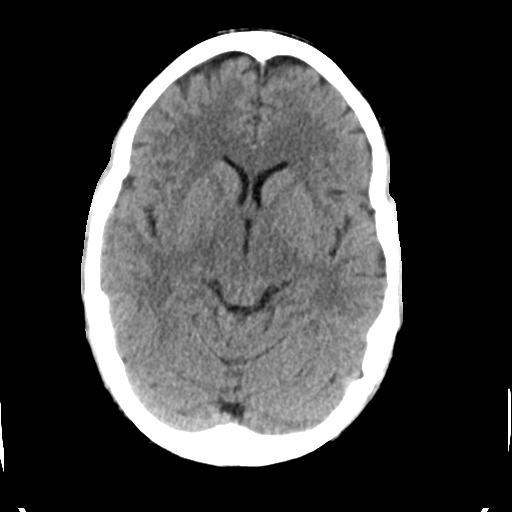
[im 15/33  brain]
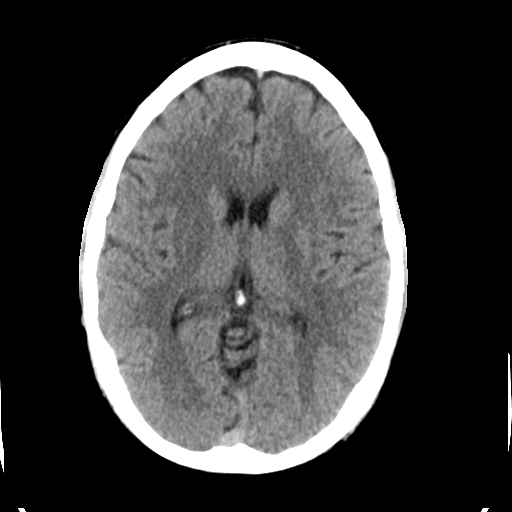
[im 15/33  bone]
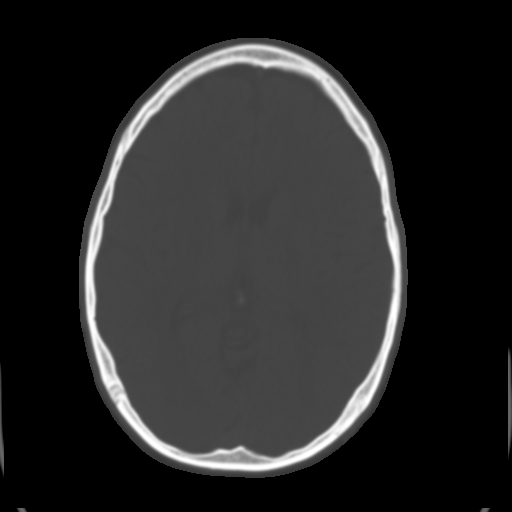
[im 18/33  brain]
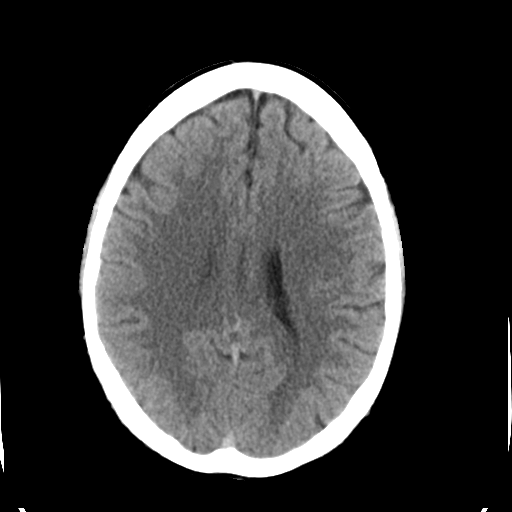
[im 21/33  brain]
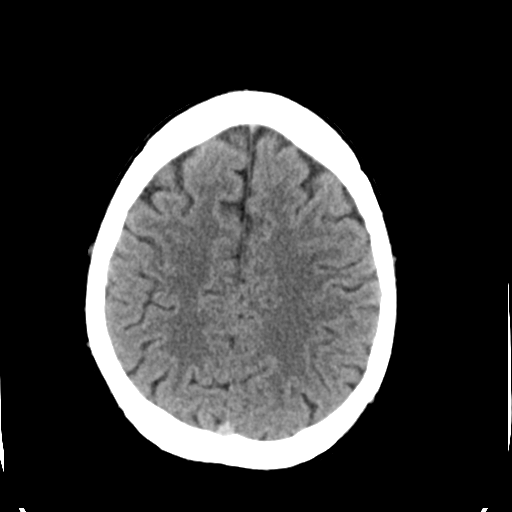
[im 25/33  brain]
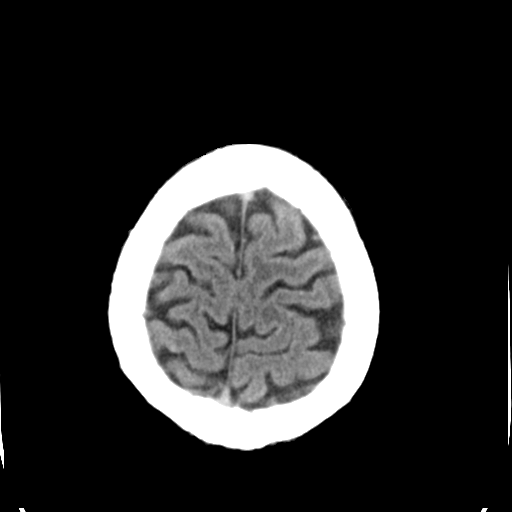
[im 27/33  brain]
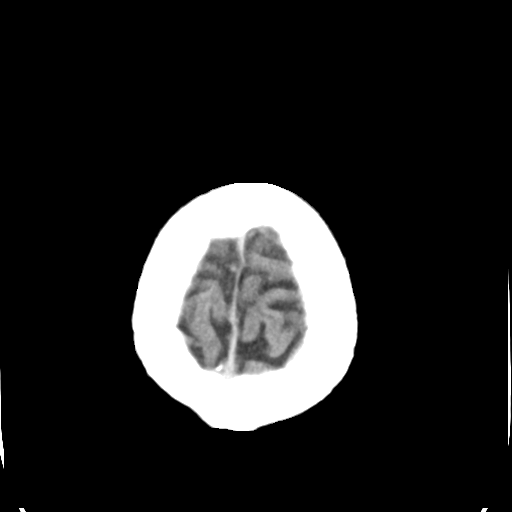
[im 27/33  bone]
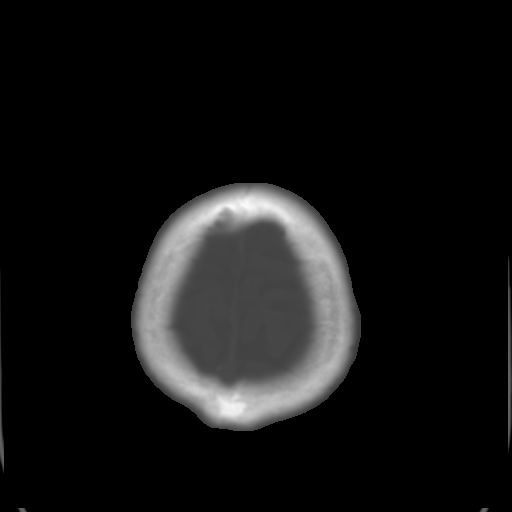
[im 30/33  brain]
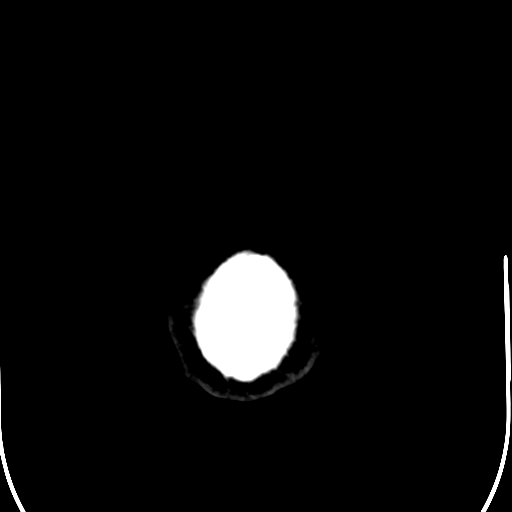

[Series 4: coronal soft tissue · coronal · 0.34mm/px · 3 of 75 slices shown]
[im 25/75  brain]
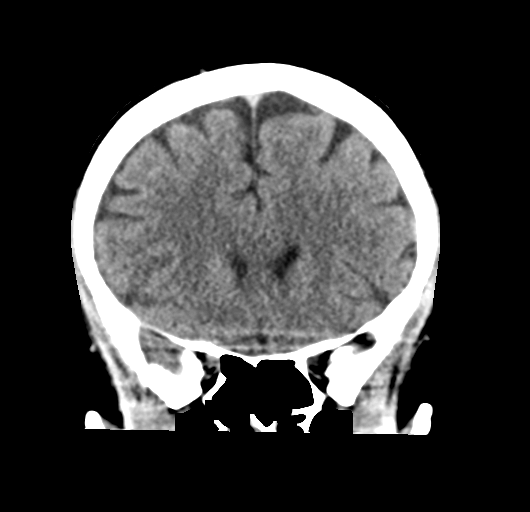
[im 33/75  brain]
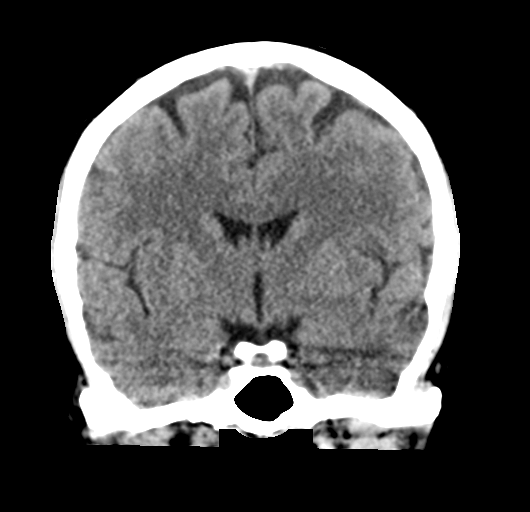
[im 42/75  brain]
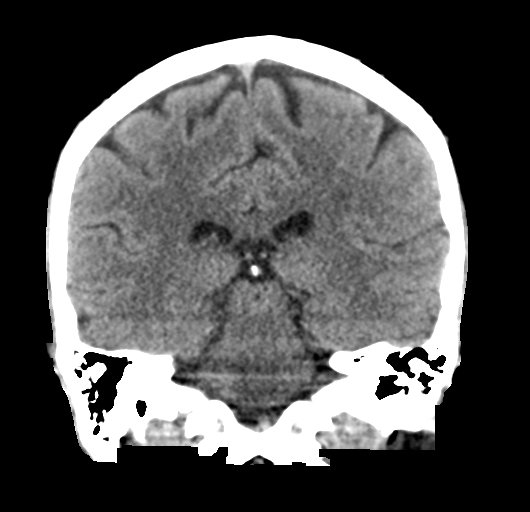

[Series 5: sagittal soft tissue · sagittal · 0.36mm/px · 3 of 67 slices shown]
[im 23/67  brain]
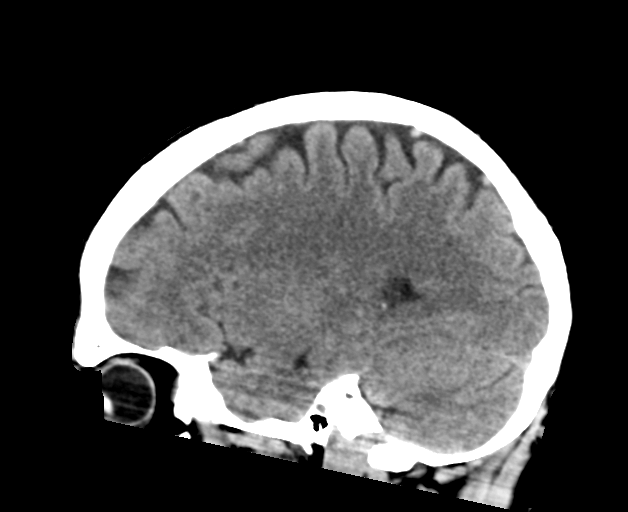
[im 34/67  brain]
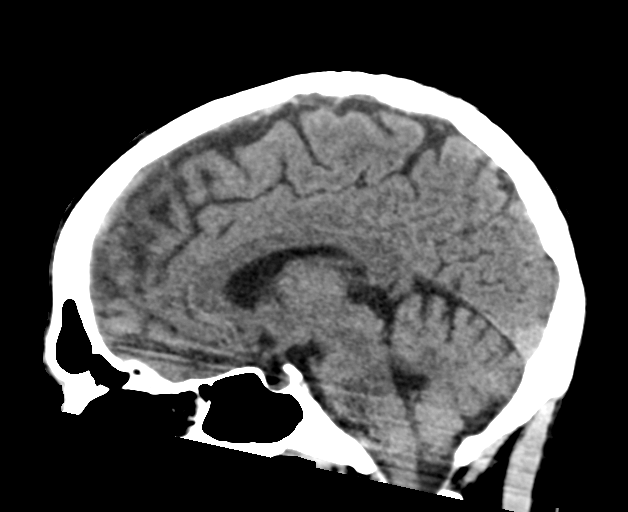
[im 45/67  brain]
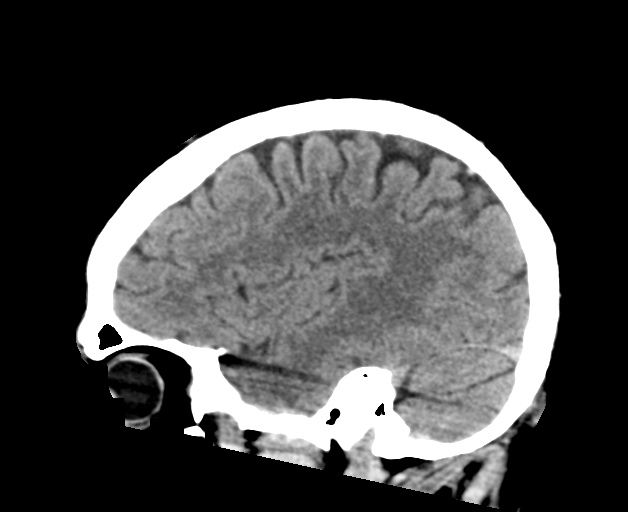

[16 of 47 positions shown; findings below may reference images not displayed]

FINDINGS: BRAIN: No intraparenchymal hemorrhage, mass effect nor midline
shift. The ventricles and sulci are normal. LEFT greater than RIGHT
temporal lobe hypodensities. No abnormal extra-axial fluid
collections. Basal cisterns are patent.

VASCULAR: Unremarkable.

SKULL/SOFT TISSUES: No skull fracture. No significant soft tissue
swelling.

ORBITS/SINUSES: The included ocular globes and orbital contents are
normal.The mastoid aircells and included paranasal sinuses are
well-aerated.

OTHER: None.
IMPRESSION: 1. Faint hypodensities bilateral temporal lobes favoring skullbase
CT artifact. If symptoms persist, consider MRI of the brain. If
potential for cerebritis, MRI with contrast would be warranted.
2. Critical Value/emergent results were called by telephone at the
time of interpretation on 07/14/2017 at [DATE] to Dr. ENNY CELIS
, who verbally acknowledged these results.
